# Patient Record
Sex: Male | Born: 1990 | Race: White | Hispanic: No | Marital: Single | Smoking: Never smoker
Health system: Southern US, Community
[De-identification: ages and names within clinical notes are randomized; demographics above are authoritative.]

---
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
## 2010-03-05 ENCOUNTER — Encounter: Admission: RE | Admit: 2010-03-05 | Discharge: 2010-03-05 | Payer: Self-pay | Admitting: Orthopedic Surgery

## 2012-08-14 ENCOUNTER — Emergency Department (INDEPENDENT_AMBULATORY_CARE_PROVIDER_SITE_OTHER): Payer: No Typology Code available for payment source

## 2012-08-14 ENCOUNTER — Emergency Department (HOSPITAL_COMMUNITY)
Admission: EM | Admit: 2012-08-14 | Discharge: 2012-08-14 | Disposition: A | Discharge status: Home / Self Care | Payer: No Typology Code available for payment source | Source: Home / Self Care | Attending: Family Medicine | Admitting: Family Medicine

## 2012-08-14 ENCOUNTER — Encounter (HOSPITAL_COMMUNITY): Payer: Self-pay | Admitting: Emergency Medicine

## 2012-08-14 DIAGNOSIS — S62609A Fracture of unspecified phalanx of unspecified finger, initial encounter for closed fracture: Secondary | ICD-10-CM

## 2012-08-14 DIAGNOSIS — S62502A Fracture of unspecified phalanx of left thumb, initial encounter for closed fracture: Secondary | ICD-10-CM

## 2012-08-14 NOTE — ED Notes (Signed)
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 Has noticed mild swelling around joints. No break in skin. No numbness or tingling. Able to bend with mild pain. Patient is alert and oriented.

## 2012-08-14 NOTE — ED Provider Notes (Signed)
History     CSN: [id number]  Arrival date & time 08/14/12  1423   First MD Initiated Contact with Patient 08/14/12 1430      Chief Complaint  Patient presents with  . Hand Injury    (Consider location/radiation/quality/duration/timing/severity/associated sxs/prior treatment) Patient is a 22 y.o. male presenting with hand injury. The history is provided by the patient.  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 Finger location:  L thumb Pain details:    Quality:  Sharp   Severity:  Moderate   Progression:  Worsening Chronicity:  New Foreign body present:  No foreign bodies Prior injury to area:  No   History reviewed. No pertinent past medical history.  History reviewed. No pertinent past surgical history.  History reviewed. No pertinent family history.  History  Substance Use Topics  . Smoking status: Not on file  . Smokeless tobacco: Not on file  . Alcohol Use: Not on file      Review of Systems  Constitutional: Negative.   Musculoskeletal: Positive for joint swelling.  Skin: Negative for wound.    Allergies  Review of patient's allergies indicates no known allergies.  Home Medications  No current outpatient prescriptions on file.  BP 120/77  Pulse 62  Temp(Src) 98.3 F (36.8 C) (Oral)  Resp 14  SpO2 100%  Physical Exam  Nursing note and vitals reviewed. Constitutional: He is oriented to person, place, and time. He appears well-developed and well-nourished.  Musculoskeletal: He exhibits tenderness.       Hands: Neurological: He is alert and oriented to person, place, and time.  Skin: Skin is warm and dry.    ED Course  Procedures (including critical care time)  Labs Reviewed - No data to display Dg Finger Thumb Left  08/14/2012  *RADIOLOGY REPORT*  Clinical Data: Left thumb injury  LEFT THUMB 2+V  Comparison: None.  Findings: Three views of the left thumb  submitted.  There is subtle cortical irregularity at the base of proximal phalanx.  This is suspicious for subtle nondisplaced fracture.  Clinical correlation is necessary.  IMPRESSION: There is  subtle cortical irregularity at the base of proximal phalanx.  This is suspicious for subtle nondisplaced fracture. Clinical correlation is necessary.   Original Report Authenticated By: Natasha Mead, M.D.      1. Thumb fracture, left, closed, initial encounter       MDM  X-rays reviewed and report per radiologist.         Linna Hoff, MD 08/14/12 [id number]

## 2012-08-17 ENCOUNTER — Other Ambulatory Visit: Payer: Self-pay | Admitting: Orthopedic Surgery

## 2012-08-17 DIAGNOSIS — M79645 Pain in left finger(s): Secondary | ICD-10-CM

## 2012-08-18 ENCOUNTER — Ambulatory Visit
Admission: RE | Admit: 2012-08-18 | Discharge: 2012-08-18 | Disposition: A | Discharge status: Home / Self Care | Payer: BC Managed Care – PPO | Source: Ambulatory Visit | Attending: Orthopedic Surgery | Admitting: Orthopedic Surgery

## 2012-08-18 DIAGNOSIS — M79645 Pain in left finger(s): Secondary | ICD-10-CM

## 2013-02-03 ENCOUNTER — Other Ambulatory Visit: Payer: Self-pay | Admitting: Orthopedic Surgery

## 2013-02-03 DIAGNOSIS — M549 Dorsalgia, unspecified: Secondary | ICD-10-CM

## 2013-02-07 ENCOUNTER — Ambulatory Visit
Admission: RE | Admit: 2013-02-07 | Discharge: 2013-02-07 | Disposition: A | Discharge status: Home / Self Care | Payer: No Typology Code available for payment source | Source: Ambulatory Visit | Attending: Orthopedic Surgery | Admitting: Orthopedic Surgery

## 2013-02-07 ENCOUNTER — Other Ambulatory Visit: Payer: BC Managed Care – PPO

## 2013-02-07 VITALS — BP 135/70 | HR 65

## 2013-02-07 DIAGNOSIS — M549 Dorsalgia, unspecified: Secondary | ICD-10-CM

## 2013-02-07 DIAGNOSIS — M5126 Other intervertebral disc displacement, lumbar region: Secondary | ICD-10-CM

## 2013-02-07 MED ORDER — IOHEXOL 180 MG/ML  SOLN
1.0000 mL | Freq: Once | INTRAMUSCULAR | Status: AC | PRN
  Administered 2013-02-07: 1 mL via EPIDURAL

## 2013-02-07 MED ORDER — METHYLPREDNISOLONE ACETATE 40 MG/ML INJ SUSP (RADIOLOG
120.0000 mg | Freq: Once | INTRAMUSCULAR | Status: AC
  Administered 2013-02-07: 120 mg via EPIDURAL

## 2013-02-09 ENCOUNTER — Other Ambulatory Visit: Payer: BC Managed Care – PPO

## 2014-04-14 ENCOUNTER — Ambulatory Visit: Payer: Self-pay | Admitting: Otolaryngology

## 2014-09-16 NOTE — Op Note (Signed)
PATIENT NAME:  Jeffrey Cain, Jeffrey Cain MR#:  [id number] DATE OF BIRTH:  July 16, 1990  DATE OF PROCEDURE:  04/14/2014  PREOPERATIVE DIAGNOSES:  1. Septal deviation.  2. Chronic bilateral maxillary sinusitis.  3. Chronic bilateral frontal sinusitis.  4. Turbinate hypertrophy and nasal obstruction.   POSTOPERATIVE DIAGNOSES:  1. Septal deviation.  2. Chronic bilateral maxillary sinusitis.  3. Chronic bilateral frontal sinusitis.  4. Bilateral turbinate hypertrophy with nasal obstruction.   OPERATIVE PROCEDURES: 1. Septoplasty.  2. Bilateral endoscopic frontal sinusotomy.  3. Bilateral endoscopic maxillary sinusotomies.  4. Bilateral endoscopic anterior ethmoidectomies.  5. Bilateral partial reduction of the inferior turbinates.  6. Image-guided sinus surgery.   ANESTHESIA: General.   COMPLICATIONS: None.   TOTAL ESTIMATED BLOOD LOSS: 200 mL.   DESCRIPTION OF PROCEDURE: The patient was given general anesthesia by oral endotracheal intubation. The nose prepped using 6 mL of 1% Xylocaine with epinephrine  1:100,000 for infiltration of the nasal septum and lateral nasal walls. The septum was markedly deviated to the left side. The nose was then packed with Cottonoid pledgets soaked in Afrin and lidocaine. The image guided system was brought in. The CT scan was downloaded from the disk into the system. The template was applied to the face and was registered to the system as well. There was 0.4 mm of variance. The suction instruments were registered to the system as well and this showed perfect alignment with the CT scan on the system. He was prepped and draped in sterile fashion.   The cottonoid pledgets were removed. The 0 degree scope was used for visualizing both sides of the nose. I could not get the scope through the left side because it was so tight. A left Killian incision was created with elevation of mucoperichondrium and the left side of the quadrangular plate. The bony cartilaginous junction  was split and some of the posterior inferior quadrangular cartilage was fractured and removed to allow it to swing back towards midline. There was overhanging cartilage on the left side for a spur. The maxillary crest was to the left as well and some of this was fractured with a chisel and freed up to provide more room inferiorly on the left. The vomer was sticking way off to the left side and much of this was removed. The ethmoid plate was removed as well, as it was protruding to the left. The mucosal flaps were placed back in their anatomic position. They were sutured in place with 4-0 plain gut suture in a through and through whipstitch fashion. The inferior turbinates were partially trimmed and electrocautery was used along its inferior border to help control bleeding. The remaining bony structure the turbinate was outfractured to give better room.   The 0 degree scope was then used to visualize the left side. The middle turbinate had been lateralized and was blocking most middle meatus. It was outfractured and trimmed some along its anterior-inferior border to give a little bit of room into the middle meatus. The Acclarent  balloon sinus instrument was used to try track into the frontal sinus to find the track; I could not find this at all. The uncinate process had to be removed here and part of the anterior wall and the anterior ethmoids were opened to use the image-guided system to finally find the opening to the frontal sinus duct. This took quite a while to sort this out using the image-guided frontal sinus suction and seeker to finally be able to track into the frontal sinus on this  side. The anterior ethmoids were opened but the posterior ethmoids were not. The remaining portion of the uncinate process was removed and the maxillary antrum was opened. A 30 degree scope was used for this suction of mucus. The natural ostium was included in the maxillary antral opening to make sure that this was clear.  Cottonoid pledgets were placed on this side. The right side was then visualized. The right middle turbinate was very bony and much enlarged. It was trimmed first to give some room into the middle meatus and visualization of this area. Again, the Acclarent balloon system was used to try to track into the frontal sinus but I was unable to find the track . Therefore, I took down the uncinate process and opened up the anterior ethmoid. As I got this opened up, we could get into the ager nasi cell but still had a difficult time finding the opening to the frontal sinus. Finally, I did find it more laterally, as there was a small lateral frontal sinus opening. The complete uncinate process was removed and the opening to the right maxillary sinus was widened using the 30 degree scope and through biting forceps and backbiting forceps. This was a narrower spot between the turbinate and the lateral wall to create the opening here.   The 0 and 30 degree scopes were used on both sides, again, along with the image-guided system to make sure that the frontal sinus duct was opened and the maxillary antrum was widely opened. Xerogel was placed into the anterior ethmoid area near the frontal sinus duct opening; this was wetted and liquefied. Another piece was put along the middle turbinate. On the right side also, this was re-visualized to make sure the frontal sinus duct was open and clear and the maxillary antrum was opened. Xerogel was placed in the frontal sinus as well as over the middle turbinate remnant.   The septum was in the midline. Nasal splints were placed on both sides using Xomed 0.5 mm regular sized splints were trimmed. These were held in position with a 3-0 nylon suture. The patient had good air flow through both sides of the nose. He was awakened and taken to the recovery room in satisfactory condition. There were no operative complications. Total estimated blood loss 200 mL.      ____________________________ Cammy CopaPaul H. Cailyn Houdek, MD phj:bm D: 04/18/2014 21:17:42 ET T: 04/19/2014 01:58:46 ET JOB#: [id number]  cc: Cammy CopaPaul H. Geddy [place], MD, <Dictator> Cammy CopaPAUL H Gumecindo Hopkin MD ELECTRONICALLY SIGNED 04/27/2014 11:32

## 2014-09-18 LAB — SURGICAL PATHOLOGY
